# Patient Record
Sex: Male | Born: 1990 | Race: Black or African American | Hispanic: No | Marital: Single | Smoking: Former smoker
Health system: Southern US, Community
[De-identification: ages and names within clinical notes are randomized; demographics above are authoritative.]

---

## 2018-01-09 ENCOUNTER — Other Ambulatory Visit: Payer: Self-pay

## 2018-01-09 ENCOUNTER — Emergency Department (HOSPITAL_COMMUNITY)
Admission: EM | Admit: 2018-01-09 | Discharge: 2018-01-09 | Disposition: A | Discharge status: Home / Self Care | Payer: Self-pay | Attending: Emergency Medicine | Admitting: Emergency Medicine

## 2018-01-09 ENCOUNTER — Encounter (HOSPITAL_COMMUNITY): Payer: Self-pay

## 2018-01-09 DIAGNOSIS — F17210 Nicotine dependence, cigarettes, uncomplicated: Secondary | ICD-10-CM | POA: Insufficient documentation

## 2018-01-09 DIAGNOSIS — R030 Elevated blood-pressure reading, without diagnosis of hypertension: Secondary | ICD-10-CM | POA: Insufficient documentation

## 2018-01-09 DIAGNOSIS — R369 Urethral discharge, unspecified: Secondary | ICD-10-CM | POA: Insufficient documentation

## 2018-01-09 DIAGNOSIS — Z202 Contact with and (suspected) exposure to infections with a predominantly sexual mode of transmission: Secondary | ICD-10-CM | POA: Insufficient documentation

## 2018-01-09 MED ORDER — CEFTRIAXONE SODIUM 250 MG IJ SOLR
250.0000 mg | Freq: Once | INTRAMUSCULAR | Status: AC
  Administered 2018-01-09: 250 mg via INTRAMUSCULAR
  Filled 2018-01-09: qty 250

## 2018-01-09 MED ORDER — METRONIDAZOLE 500 MG PO TABS
2000.0000 mg | ORAL_TABLET | Freq: Once | ORAL | Status: AC
  Administered 2018-01-09: 2000 mg via ORAL
  Filled 2018-01-09: qty 4

## 2018-01-09 MED ORDER — AZITHROMYCIN 250 MG PO TABS
1000.0000 mg | ORAL_TABLET | Freq: Once | ORAL | Status: AC
  Administered 2018-01-09: 1000 mg via ORAL
  Filled 2018-01-09: qty 4

## 2018-01-09 NOTE — ED Triage Notes (Signed)
Pt states that he has been have penial discharge for 2 days. Some burning with urination, denies abdominal pain.

## 2018-01-09 NOTE — ED Notes (Signed)
Pt stable, ambulatory, states understanding of discharge instructions 

## 2018-01-09 NOTE — Discharge Instructions (Addendum)
Medications: You are empirically treated for gonorrhea, chlamydia, and trichomonas today. Use a condom with every sexual encounter Follow up with the health department regarding today's visit.  Please return to the ER for worsening symptoms, high fevers or persistent vomiting.  You have been tested for chlamydia and gonorrhea. These results will be available in approximately 3 days. You will be notified if they are positive.   It is very important to practice safe sex and use condoms when sexually active. If your results are positive you need to notify all sexual partners so they can be treated as well. The website [email or web address].net/ can be used to send anonymous text messages or emails to alert sexual contacts.   SEEK IMMEDIATE MEDICAL CARE IF:  You develop an oral temperature above 100.4 F (38.9 C), not controlled by medications or lasting more than 2 days.  You develop testicular pain, rectal pain, abdominal pain, nausea or vomiting.  Vitals:   01/09/18 1742  BP: (!) 149/98  Pulse: 78  Resp: 18  Temp: 98.6 F (37 C)  SpO2: 99%   Your blood pressure is elevated today.  Please follow-up with your primary care provider.  To find a primary care or specialty doctor please call [phone] or [phone] to access " Find a Doctor Service."  You may also go on the [facility] website at [email or web address]  There are also multiple [facility] and [facility] practices throughout the [place] that are frequently accepting new patients. You may find a clinic that is close to your home and contact them.  [facility] - [address] [place] [phone]  [facility] in [place] (also locations in [place] and [place]) - [address] [place] [phone]  [facility] Department - [address] [place] [phone]   Thank you for allowing  [place] to participate in your care today.

## 2018-01-09 NOTE — ED Provider Notes (Signed)
[facility] EMERGENCY DEPARTMENT Provider Note   CSN: [id number] Arrival date & time: 01/09/18  1723     History   Chief Complaint Chief Complaint  Patient presents with  . SEXUALLY TRANSMITTED DISEASE    HPI Jerome Duran is a 27 y.o. male.  HPI  Patient is a 27 year old male with no significant past medical history presenting for penile discharge.  He reports that the symptoms have been occurring for the past 3 days.  He is concerned about sexually transmitted infection.  He reports that he has had 4 male sexual partners with whom he engaged in vaginal intercourse over the past 3 months.  Denies oral intercourse.  Denies anal intercourse.  Patient denies any fevers, chills, lower abdominal pain, testicular pain or swelling, rectal pain, or nausea or vomiting.  History reviewed. No pertinent past medical history.  There are no active problems to display for this patient.   History reviewed. No pertinent surgical history.      Home Medications    Prior to Admission medications   Not on File    Family History No family history on file.  Social History Social History   Tobacco Use  . Smoking status: Current Every Day Smoker    Packs/day: 0.25    Types: Cigarettes  . Smokeless tobacco: Never Used  Substance Use Topics  . Alcohol use: Never    Frequency: Never  . Drug use: Never     Allergies   Patient has no known allergies.   Review of Systems Review of Systems  Constitutional: Negative for chills and fever.  Gastrointestinal: Negative for abdominal pain, nausea, rectal pain and vomiting.  Genitourinary: Positive for discharge. Negative for dysuria, penile pain, penile swelling, scrotal swelling and testicular pain.  Skin: Negative for rash.     Physical Exam Updated Vital Signs BP (!) 149/98 (BP Location: Right Arm)   Pulse 78   Temp 98.6 F (37 C) (Oral)   Resp 18   Ht 5\' 5"  (1.651 m)   Wt 79.8 kg   SpO2 99%   BMI  29.29 kg/m   Physical Exam  Constitutional: He appears well-developed and well-nourished. No distress.  Sitting comfortably in bed.  HENT:  Head: Normocephalic and atraumatic.  Eyes: Conjunctivae are normal. Right eye exhibits no discharge. Left eye exhibits no discharge.  EOMs normal to gross examination.  Neck: Normal range of motion.  Cardiovascular: Normal rate and regular rhythm.  Intact, 2+ radial pulse.  Pulmonary/Chest:  Normal respiratory effort. Patient converses comfortably. No audible wheeze or stridor.  Abdominal: Soft. He exhibits no distension. There is no tenderness. There is no guarding.  Genitourinary:  Genitourinary Comments: GU examination performed with EMT chaperone present.  Patient has horizontal chain lymphadenopathy.  No lesions of the testicles, penis, or inguinal region.  No rash.  No pain to penile shaft.  There is milky white discharge expressed from the penis.  No testicular pain, swelling, or erythema.  Musculoskeletal: Normal range of motion.  Neurological: He is alert.  Cranial nerves intact to gross observation. Patient moves extremities without difficulty.  Skin: Skin is warm and dry. He is not diaphoretic.  Psychiatric: He has a normal mood and affect. His behavior is normal. Judgment and thought content normal.  Nursing note and vitals reviewed.    ED Treatments / Results  Labs (all labs ordered are listed, but only abnormal results are displayed) Labs Reviewed  GC/CHLAMYDIA PROBE AMP ([place]) NOT AT [facility]  EKG None  Radiology No results found.  Procedures Procedures (including critical care time)  Medications Ordered in ED Medications  cefTRIAXone (ROCEPHIN) injection 250 mg (has no administration in time range)  azithromycin (ZITHROMAX) tablet 1,000 mg (has no administration in time range)  metroNIDAZOLE (FLAGYL) tablet 2,000 mg (has no administration in time range)     Initial Impression / Assessment and Plan / ED  Course  I have reviewed the triage vital signs and the nursing notes.  Pertinent labs & imaging results that were available during my care of the patient were reviewed by me and considered in my medical decision making (see chart for details).  Clinical Course as of Jan 09 2054  Tue Jan 09, 2018  2045 Declined HIV/Syphilis testing.   [AM]    Clinical Course User Index [AM] Elisha Ponder, PA-C    Patient is well-appearing and in no acute distress.  Based on symptoms and exam, suspect urethritis.  No signs of epididymitis, prostatitis, or ascending infections.  Will treat empirically.  Patient declined HIV and syphilis testing, but does state that he will follow-up with the health department for this testing.  Return precautions were given for any testicular pain, abdominal pain, fever chills, nausea, vomiting, rectal pain.  Patient informed that he will receive a call for positive results, and he will need to notify partners.  Patient is in understanding and agrees with the plan of care.  Blood pressure is elevated today.  Recommended follow-up with primary care.  Final Clinical Impressions(s) / ED Diagnoses   Final diagnoses:  STD exposure  Elevated blood pressure reading without diagnosis of hypertension    ED Discharge Orders    None       Delia Chimes 01/09/18 2106    Tilden Fossa, MD 01/15/18 [phone]

## 2018-01-09 NOTE — ED Notes (Signed)
Gave patient turkey sandwich and drink  

## 2018-01-11 LAB — GC/CHLAMYDIA PROBE AMP (~~LOC~~) NOT AT ARMC
CHLAMYDIA, DNA PROBE: POSITIVE — AB
NEISSERIA GONORRHEA: POSITIVE — AB

## 2018-06-15 ENCOUNTER — Emergency Department (HOSPITAL_COMMUNITY)
Admission: EM | Admit: 2018-06-15 | Discharge: 2018-06-16 | Disposition: A | Discharge status: Home / Self Care | Payer: Self-pay | Attending: Emergency Medicine | Admitting: Emergency Medicine

## 2018-06-15 ENCOUNTER — Encounter (HOSPITAL_COMMUNITY): Payer: Self-pay | Admitting: Emergency Medicine

## 2018-06-15 ENCOUNTER — Other Ambulatory Visit: Payer: Self-pay

## 2018-06-15 DIAGNOSIS — F17210 Nicotine dependence, cigarettes, uncomplicated: Secondary | ICD-10-CM | POA: Insufficient documentation

## 2018-06-15 DIAGNOSIS — K625 Hemorrhage of anus and rectum: Secondary | ICD-10-CM | POA: Insufficient documentation

## 2018-06-15 DIAGNOSIS — K529 Noninfective gastroenteritis and colitis, unspecified: Secondary | ICD-10-CM | POA: Insufficient documentation

## 2018-06-15 LAB — CBC
HCT: 48.7 % (ref 39.0–52.0)
Hemoglobin: 15.9 g/dL (ref 13.0–17.0)
MCH: 29.4 pg (ref 26.0–34.0)
MCHC: 32.6 g/dL (ref 30.0–36.0)
MCV: 90 fL (ref 80.0–100.0)
Platelets: 396 10*3/uL (ref 150–400)
RBC: 5.41 MIL/uL (ref 4.22–5.81)
RDW: 13.1 % (ref 11.5–15.5)
WBC: 6.9 10*3/uL (ref 4.0–10.5)
nRBC: 0 % (ref 0.0–0.2)

## 2018-06-15 LAB — POC OCCULT BLOOD, ED: Fecal Occult Bld: POSITIVE — AB

## 2018-06-15 LAB — COMPREHENSIVE METABOLIC PANEL
ALT: 17 U/L (ref 0–44)
AST: 23 U/L (ref 15–41)
Albumin: 3.7 g/dL (ref 3.5–5.0)
Alkaline Phosphatase: 88 U/L (ref 38–126)
Anion gap: 11 (ref 5–15)
BUN: 8 mg/dL (ref 6–20)
CO2: 27 mmol/L (ref 22–32)
Calcium: 9.3 mg/dL (ref 8.9–10.3)
Chloride: 101 mmol/L (ref 98–111)
Creatinine, Ser: 1.06 mg/dL (ref 0.61–1.24)
GFR calc Af Amer: >60 mL/min (ref >60)
GFR calc non Af Amer: >60 mL/min (ref >60)
Glucose, Bld: 104 mg/dL — ABNORMAL HIGH (ref 70–99)
Potassium: 3.6 mmol/L (ref 3.5–5.1)
Sodium: 139 mmol/L (ref 135–145)
Total Bilirubin: 0.3 mg/dL (ref 0.3–1.2)
Total Protein: 7.6 g/dL (ref 6.5–8.1)

## 2018-06-15 LAB — TYPE AND SCREEN
ABO/RH(D): B POS
Antibody Screen: NEGATIVE

## 2018-06-15 LAB — ABO/RH: ABO/RH(D): B POS

## 2018-06-15 MED ORDER — SODIUM CHLORIDE 0.9 % IV BOLUS
1000.0000 mL | Freq: Once | INTRAVENOUS | Status: AC
  Administered 2018-06-16: 1000 mL via INTRAVENOUS

## 2018-06-15 NOTE — ED Notes (Signed)
ED Provider at bedside. 

## 2018-06-15 NOTE — ED Triage Notes (Signed)
C/o diarrhea, rectal bleeding, and generalized abd cramping x 4-5 days.

## 2018-06-15 NOTE — ED Provider Notes (Addendum)
[facility] EMERGENCY DEPARTMENT Provider Note   CSN: [id number] Arrival date & time: 06/15/18  1956    History   Chief Complaint Chief Complaint  Patient presents with  . Diarrhea  . Rectal Bleeding    HPI Jerome Duran is a 28 y.o. male with a hx of no major medical problems presents to the Emergency Department complaining of gradual, persistent, progressively worsening diarrhea onset 4-5 days ago.  Pt reports stools are very loose and fluffy with some associated bleeding.  Pt reports a moderate amount of bright red bleeding only with bowel movements.  Pt reports generalized abd soreness that becomes intense abd cramping and pain just before and during bowel movement.  Pain resolves after BMs.  Pt denies travel, known sick contacts, camping, change in diet.  Pt denies FHx of crohn's or UC.  Pt denies hx of chronic constipation or diarrhea. Pt tried 1 dose of imodium and 2 doses of pepto bismol without relief.  No known aggravating or alleviating factors.  Pt reports eating and drinking normally.  No fever, cough, nasal congestion, shortness of breath.     The history is provided by the patient and medical records. No language interpreter was used.    History reviewed. No pertinent past medical history.  There are no active problems to display for this patient.   History reviewed. No pertinent surgical history.      Home Medications    Prior to Admission medications   Medication Sig Start Date End Date Taking? Authorizing Provider  ciprofloxacin (CIPRO) 500 MG tablet Take 1 tablet (500 mg total) by mouth 2 (two) times daily. One po bid x 7 days 06/16/18   Teghan Philbin, Dahlia Client, PA-C  metroNIDAZOLE (FLAGYL) 500 MG tablet Take 1 tablet (500 mg total) by mouth 2 (two) times daily. 06/16/18   Swayze Pries, Dahlia Client, PA-C  ondansetron (ZOFRAN ODT) 4 MG disintegrating tablet 4mg  ODT q4 hours prn nausea/vomit 06/16/18   Kiara Keep, Dahlia Client, PA-C    Family History  No family history on file.  Social History Social History   Tobacco Use  . Smoking status: Current Every Day Smoker    Packs/day: 0.25    Types: Cigarettes  . Smokeless tobacco: Never Used  Substance Use Topics  . Alcohol use: Never    Frequency: Never  . Drug use: Never     Allergies   Patient has no known allergies.   Review of Systems Review of Systems  Constitutional: Negative for appetite change, diaphoresis, fatigue, fever and unexpected weight change.  HENT: Negative for mouth sores.   Eyes: Negative for visual disturbance.  Respiratory: Negative for cough, chest tightness, shortness of breath and wheezing.   Cardiovascular: Negative for chest pain.  Gastrointestinal: Positive for abdominal pain, anal bleeding, blood in stool, diarrhea and hematochezia. Negative for constipation, nausea and vomiting.  Endocrine: Negative for polydipsia, polyphagia and polyuria.  Genitourinary: Negative for dysuria, frequency, hematuria and urgency.  Musculoskeletal: Negative for back pain and neck stiffness.  Skin: Negative for rash.  Allergic/Immunologic: Negative for immunocompromised state.  Neurological: Negative for syncope, light-headedness and headaches.  Hematological: Does not bruise/bleed easily.  Psychiatric/Behavioral: Negative for sleep disturbance. The patient is not nervous/anxious.      Physical Exam Updated Vital Signs BP (!) 155/106 (BP Location: Right Arm)   Pulse 84   Temp 98.9 F (37.2 C) (Oral)   Resp 18   SpO2 99%   Physical Exam Vitals signs and nursing note reviewed. Exam conducted with a chaperone  present.  Constitutional:      General: He is not in acute distress.    Appearance: He is well-developed. He is not diaphoretic.     Comments: Awake, alert, nontoxic appearance  HENT:     Head: Normocephalic and atraumatic.     Comments: Moist mucous membranes    Mouth/Throat:     Pharynx: No oropharyngeal exudate.  Eyes:     General: No scleral  icterus.    Conjunctiva/sclera: Conjunctivae normal.     Comments: Mucous membranes NOT pale  Neck:     Musculoskeletal: Normal range of motion and neck supple.  Cardiovascular:     Rate and Rhythm: Normal rate and regular rhythm.  Pulmonary:     Effort: Pulmonary effort is normal. No respiratory distress.     Breath sounds: Normal breath sounds. No wheezing.  Abdominal:     General: Bowel sounds are normal.     Palpations: Abdomen is soft. There is no mass.     Tenderness: There is generalized abdominal tenderness ( throughout). There is no guarding or rebound.  Genitourinary:    Prostate: Normal.     Rectum: Guaiac result positive. No mass, tenderness, anal fissure, external hemorrhoid or internal hemorrhoid. Normal anal tone.     Comments: No obvious source of bleeding, but BRB on DRE noted. Musculoskeletal: Normal range of motion.  Skin:    General: Skin is warm and dry.  Neurological:     Mental Status: He is alert.     Comments: Speech is clear and goal oriented Moves extremities without ataxia      ED Treatments / Results  Labs (all labs ordered are listed, but only abnormal results are displayed) Labs Reviewed  COMPREHENSIVE METABOLIC PANEL - Abnormal; Notable for the following components:      Result Value   Glucose, Bld 104 (*)    All other components within normal limits  POC OCCULT BLOOD, ED - Abnormal; Notable for the following components:   Fecal Occult Bld POSITIVE (*)    All other components within normal limits  GASTROINTESTINAL PANEL BY PCR, STOOL (REPLACES STOOL CULTURE)  OVA + PARASITE EXAM  CBC  TYPE AND SCREEN  ABO/RH    EKG None  Radiology Ct Abdomen Pelvis W Contrast  Result Date: 06/16/2018 CLINICAL DATA:  Initial evaluation for acute generalized abdominal pain. EXAM: CT ABDOMEN AND PELVIS WITH CONTRAST TECHNIQUE: Multidetector CT imaging of the abdomen and pelvis was performed using the standard protocol following bolus administration of  intravenous contrast. CONTRAST:  OMNIPAQUE IOHEXOL 300 MG/ML  SOLN COMPARISON:  None. FINDINGS: Lower chest: Visualized lung bases are clear. Hepatobiliary: Liver demonstrates a normal contrast enhanced appearance. Gallbladder within normal limits. No biliary dilatation. Pancreas: Pancreas within normal limits. Spleen: Spleen within normal limits. Adrenals/Urinary Tract: Adrenal glands are normal. Kidneys equal in size with symmetric enhancement. Subcentimeter hypodensity within the interpolar left kidney noted, too small the characterize, but statistically likely reflects a small cyst. No nephrolithiasis, hydronephrosis, or focal enhancing renal mass. No hydroureter. Partially distended bladder within normal limits. Stomach/Bowel: Stomach within normal limits. No evidence for bowel obstruction. Normal appendix. Circumferential wall thickening with edema and mild hazy stranding about the colon extending from the mid-distal transverse colon to the rectum, consistent with acute colitis. This could be either infectious or inflammatory in nature. No associated complication. No other acute inflammatory changes seen about the bowels. Vascular/Lymphatic: Normal intravascular enhancement seen throughout the intra-abdominal aorta. Mesenteric vessels are patent proximally. No adenopathy. Reproductive:  Prostate and seminal vesicles are normal. Other: No free air or fluid. Musculoskeletal: No acute osseous finding. No discrete lytic or blastic osseous lesions. IMPRESSION: 1. Circumferential wall thickening with edema and hazy stranding about the colon, consistent with acute colitis. This may be either infectious or inflammatory in nature. No complication. 2. No other acute intra-abdominal or pelvic process. Electronically Signed   By: Rise MuBenjamin  McClintock M.D.   On: 06/16/2018 01:36    Procedures Procedures (including critical care time)  Medications Ordered in ED Medications  ciprofloxacin (CIPRO) tablet 500 mg  (has no administration in time range)  metroNIDAZOLE (FLAGYL) tablet 500 mg (has no administration in time range)  sodium chloride 0.9 % bolus 1,000 mL (0 mLs Intravenous Stopped 06/16/18 0238)  iohexol (OMNIPAQUE) 300 MG/ML solution 100 mL (100 mLs Intravenous Contrast Given 06/16/18 0045)     Initial Impression / Assessment and Plan / ED Course  I have reviewed the triage vital signs and the nursing notes.  Pertinent labs & imaging results that were available during my care of the patient were reviewed by me and considered in my medical decision making (see chart for details).  Clinical Course as of Jun 15 236  Fri Jun 15, 2018  2356 No anemia  Hemoglobin: 15.9 [HM]  2356 Pt initially hypertensive with spontaneous resolution during time in the ED  BP: 122/69 [HM]  2357 No tachycardia to suggest dehydration  Pulse Rate: 74 [HM]    Clinical Course User Index [HM] Cottrell Gentles, Theotis BarrioHannah, PA-C       Sirius Worrel was evaluated in Emergency Department on 06/16/2018 for the symptoms described in the history of present illness. He was evaluated in the context of the global COVID-19 pandemic, which necessitated consideration that the patient might be at risk for infection with the SARS-CoV-2 virus that causes COVID-19. Institutional protocols and algorithms that pertain to the evaluation of patients at risk for COVID-19 are in a state of rapid change based on information released by regulatory bodies including the CDC and federal and state organizations. These policies and algorithms were followed during the patient's care in the ED.   Pt presents with abdominal pain, diarrhea and rectal bleeding.  No evidence of hemorrhoids or anal fissure on exam.  He does have bright red blood per rectum on DRE.  No previous history of ulcerative colitis or Crohn's disease.  CT scan does show active colitis.  I personally evaluated these images.  GI pathogen and O&P pending.  Will give Cipro and Flagyl for  potential bacterial etiology.  Repeat exam, abdomen remains slightly tender but is without rebound, guarding or distention.  Discussed with patient possibility of bacterial, viral or autoimmune etiology.  He will need close gastroenterology follow-up.  Also discussed reasons to return immediately to the emergency department.  States understanding and is in agreement with the plan.  Final Clinical Impressions(s) / ED Diagnoses   Final diagnoses:  Colitis  Rectal bleeding    ED Discharge Orders         Ordered    ciprofloxacin (CIPRO) 500 MG tablet  2 times daily     06/16/18 0231    metroNIDAZOLE (FLAGYL) 500 MG tablet  2 times daily     06/16/18 0231    ondansetron (ZOFRAN ODT) 4 MG disintegrating tablet     06/16/18 0231             Jayvin Hurrell, [place], PA-C 06/16/18 0239    Long, Arlyss RepressJoshua G, MD 06/16/18 1344

## 2018-06-16 ENCOUNTER — Emergency Department (HOSPITAL_COMMUNITY): Payer: Self-pay

## 2018-06-16 ENCOUNTER — Telehealth (HOSPITAL_COMMUNITY): Payer: Self-pay

## 2018-06-16 LAB — GASTROINTESTINAL PANEL BY PCR, STOOL (REPLACES STOOL CULTURE)
Adenovirus F40/41: NOT DETECTED
Astrovirus: NOT DETECTED
Campylobacter species: DETECTED — AB
Cryptosporidium: NOT DETECTED
Cyclospora cayetanensis: NOT DETECTED
Entamoeba histolytica: NOT DETECTED
Enteroaggregative E coli (EAEC): NOT DETECTED
Enteropathogenic E coli (EPEC): NOT DETECTED
Enterotoxigenic E coli (ETEC): NOT DETECTED
Giardia lamblia: DETECTED — AB
Norovirus GI/GII: NOT DETECTED
Plesimonas shigelloides: NOT DETECTED
Rotavirus A: NOT DETECTED
Salmonella species: NOT DETECTED
Sapovirus (I, II, IV, and V): NOT DETECTED
Shiga like toxin producing E coli (STEC): NOT DETECTED
Shigella/Enteroinvasive E coli (EIEC): DETECTED — AB
Vibrio cholerae: NOT DETECTED
Vibrio species: NOT DETECTED
Yersinia enterocolitica: NOT DETECTED

## 2018-06-16 MED ORDER — METRONIDAZOLE 500 MG PO TABS: 500.0000 mg | ORAL_TABLET | Freq: Two times a day (BID) | ORAL | 0 refills | Status: AC

## 2018-06-16 MED ORDER — ONDANSETRON 4 MG PO TBDP: ORAL_TABLET | ORAL | 0 refills | Status: AC

## 2018-06-16 MED ORDER — CIPROFLOXACIN HCL 500 MG PO TABS: 500.0000 mg | ORAL_TABLET | Freq: Two times a day (BID) | ORAL | 0 refills | Status: AC

## 2018-06-16 MED ORDER — CIPROFLOXACIN HCL 500 MG PO TABS
500.0000 mg | ORAL_TABLET | Freq: Once | ORAL | Status: AC
  Administered 2018-06-16: 03:00:00 500 mg via ORAL
  Filled 2018-06-16: qty 1

## 2018-06-16 MED ORDER — METRONIDAZOLE 500 MG PO TABS
500.0000 mg | ORAL_TABLET | Freq: Once | ORAL | Status: AC
  Administered 2018-06-16: 03:00:00 500 mg via ORAL
  Filled 2018-06-16: qty 1

## 2018-06-16 MED ORDER — IOHEXOL 300 MG/ML  SOLN
100.0000 mL | Freq: Once | INTRAMUSCULAR | Status: AC | PRN
  Administered 2018-06-16: 01:00:00 100 mL via INTRAVENOUS

## 2018-06-16 NOTE — Discharge Instructions (Addendum)
1. Medications: zofran, Cipro, Flagyl, usual home medications °2. Treatment: rest, drink plenty of fluids, advance diet slowly °3. Follow Up: Please followup with your primary doctor in 2 days for discussion of your diagnoses and further evaluation after today's visit; if you do not have a primary care doctor use the resource guide provided to find one; Please return to the ER for persistent vomiting, high fevers or worsening symptoms ° ° ° °

## 2018-06-20 LAB — O&P RESULT

## 2018-06-20 LAB — OVA + PARASITE EXAM

## 2018-06-23 ENCOUNTER — Emergency Department (HOSPITAL_COMMUNITY)
Admission: EM | Admit: 2018-06-23 | Discharge: 2018-06-23 | Disposition: A | Discharge status: Home / Self Care | Payer: Self-pay | Attending: Emergency Medicine | Admitting: Emergency Medicine

## 2018-06-23 ENCOUNTER — Other Ambulatory Visit: Payer: Self-pay

## 2018-06-23 ENCOUNTER — Encounter (HOSPITAL_COMMUNITY): Payer: Self-pay | Admitting: *Deleted

## 2018-06-23 DIAGNOSIS — B309 Viral conjunctivitis, unspecified: Secondary | ICD-10-CM | POA: Insufficient documentation

## 2018-06-23 DIAGNOSIS — F17210 Nicotine dependence, cigarettes, uncomplicated: Secondary | ICD-10-CM | POA: Insufficient documentation

## 2018-06-23 DIAGNOSIS — Z79899 Other long term (current) drug therapy: Secondary | ICD-10-CM | POA: Insufficient documentation

## 2018-06-23 MED ORDER — ARTIFICIAL TEARS OPHTHALMIC OINT
TOPICAL_OINTMENT | Freq: Once | OPHTHALMIC | Status: AC
  Administered 2018-06-23: 1 via OPHTHALMIC
  Filled 2018-06-23: qty 3.5

## 2018-06-23 NOTE — ED Provider Notes (Signed)
[facility]-EMERGENCY DEPT Provider Note   CSN: [id number] Arrival date & time: 06/23/18  1926    History   Chief Complaint Chief Complaint  Patient presents with  . Conjunctivitis    HPI Jerome Duran is a 28 y.o. male.     28 y.o male with no PMH presents to the ED with a chief complaint of BL eye redness x 1 week. Patient reports he first noted the redness in one eye and then had switched to the other eye.  He reports there is itching to it, states when waking up in the mornings there is yellow crusting surrounding both eyes.  Patient was seen in the emergency department a week ago for diarrhea, state the symptoms have resolved and his eyes began appearing red.  He also reports some oral lesions in his mouth, states he has got multiple canker sores present.  He denies any fever, difficulty with vision, or vission changes.        Home Medications    Prior to Admission medications   Medication Sig Start Date End Date Taking? Authorizing Provider  ciprofloxacin (CIPRO) 500 MG tablet Take 1 tablet (500 mg total) by mouth 2 (two) times daily. One po bid x 7 days 06/16/18   Muthersbaugh, Dahlia ClientHannah, PA-C  metroNIDAZOLE (FLAGYL) 500 MG tablet Take 1 tablet (500 mg total) by mouth 2 (two) times daily. 06/16/18   Muthersbaugh, Dahlia ClientHannah, PA-C  ondansetron (ZOFRAN ODT) 4 MG disintegrating tablet 4mg  ODT q4 hours prn nausea/vomit 06/16/18   Muthersbaugh, Dahlia ClientHannah, PA-C    Family History No family history on file.  Social History Social History   Tobacco Use  . Smoking status: Current Every Day Smoker    Packs/day: 0.25    Types: Cigarettes  . Smokeless tobacco: Never Used  Substance Use Topics  . Alcohol use: Never    Frequency: Never  . Drug use: Never     Allergies   Patient has no known allergies.   Review of Systems Review of Systems  Constitutional: Negative for fever.  Eyes: Positive for redness and itching. Negative for pain.  Neurological:  Negative for headaches.     Physical Exam Updated Vital Signs BP (!) 144/103 (BP Location: Left Arm)   Pulse 96   Temp 99.2 F (37.3 C) (Oral)   Resp 18   SpO2 95%   Physical Exam Vitals signs and nursing note reviewed.  Constitutional:      Appearance: He is well-developed.  HENT:     Head: Normocephalic and atraumatic.  Eyes:     General: No scleral icterus.    Conjunctiva/sclera:     Right eye: Right conjunctiva is injected. Chemosis present. No exudate or hemorrhage.    Left eye: Left conjunctiva is injected. No exudate or hemorrhage.    Pupils: Pupils are equal, round, and reactive to light.  Neck:     Musculoskeletal: Normal range of motion.  Cardiovascular:     Rate and Rhythm: Normal rate and regular rhythm.     Heart sounds: Normal heart sounds.  Pulmonary:     Effort: Pulmonary effort is normal.     Breath sounds: Normal breath sounds. No wheezing.  Chest:     Chest wall: No tenderness.  Abdominal:     General: Bowel sounds are normal. There is no distension.     Palpations: Abdomen is soft.     Tenderness: There is no abdominal tenderness.  Musculoskeletal:        General: No  tenderness or deformity.  Skin:    General: Skin is warm and dry.  Neurological:     Mental Status: He is alert and oriented to person, place, and time.     Visual Acuity  Right Eye Distance: 20/40 Left Eye Distance: 20/50 Bilateral Distance: 20/50  Right Eye Near:   Left Eye Near:    Bilateral Near:       ED Treatments / Results  Labs (all labs ordered are listed, but only abnormal results are displayed) Labs Reviewed - No data to display  EKG None  Radiology No results found.  Procedures Procedures (including critical care time)  Medications Ordered in ED Medications  artificial tears (LACRILUBE) ophthalmic ointment (has no administration in time range)     Initial Impression / Assessment and Plan / ED Course  I have reviewed the triage vital signs and  the nursing notes.  Pertinent labs & imaging results that were available during my care of the patient were reviewed by me and considered in my medical decision making (see chart for details).      Patient with no PMH presents to the ED with a chief complaint of BL eye redness, has tried over the counter drops without relieve. Patient reports no changes in vision but does have crusting of the eyes in the morning. He reports no changes in vision, loss in vision.  Visual acuity was within normal limits, suspicion for viral versus bacterial conjunctivitis, more likely viral at this time.  Will provide patient with artificial tears.  He is to follow-up outpatient.  Denies any fevers, other complaints at this time.  Return precautions provided.   Final Clinical Impressions(s) / ED Diagnoses   Final diagnoses:  Acute viral conjunctivitis of both eyes    ED Discharge Orders    None       Claude Manges, Cordelia Poche 06/23/18 2027    Derwood Kaplan, MD 06/23/18 2042

## 2018-06-23 NOTE — Discharge Instructions (Signed)
I have provided drops to help with the redness to your eyes, please use this daily.  You may also take some Benadryl or Claritin daily to help with itching.  Please return to the ED if you experience any worsening symptoms, changes in vision or loss of vision.

## 2018-06-23 NOTE — ED Triage Notes (Signed)
Bilateral eye redness/irritation x 7 days.

## 2019-03-03 ENCOUNTER — Emergency Department (HOSPITAL_COMMUNITY)
Admission: EM | Admit: 2019-03-03 | Discharge: 2019-03-03 | Disposition: A | Discharge status: Home / Self Care | Payer: Self-pay | Attending: Emergency Medicine | Admitting: Emergency Medicine

## 2019-03-03 ENCOUNTER — Other Ambulatory Visit: Payer: Self-pay

## 2019-03-03 DIAGNOSIS — R369 Urethral discharge, unspecified: Secondary | ICD-10-CM | POA: Insufficient documentation

## 2019-03-03 DIAGNOSIS — F17210 Nicotine dependence, cigarettes, uncomplicated: Secondary | ICD-10-CM | POA: Insufficient documentation

## 2019-03-03 DIAGNOSIS — Z79899 Other long term (current) drug therapy: Secondary | ICD-10-CM | POA: Insufficient documentation

## 2019-03-03 LAB — URINALYSIS, ROUTINE W REFLEX MICROSCOPIC
Bilirubin Urine: NEGATIVE
Glucose, UA: NEGATIVE mg/dL
Ketones, ur: NEGATIVE mg/dL
Nitrite: NEGATIVE
Protein, ur: NEGATIVE mg/dL
Specific Gravity, Urine: 1.025 (ref 1.005–1.030)
WBC, UA: >50 WBC/hpf — ABNORMAL HIGH (ref 0–5)
pH: 5 (ref 5.0–8.0)

## 2019-03-03 LAB — HIV ANTIBODY (ROUTINE TESTING W REFLEX): HIV Screen 4th Generation wRfx: NONREACTIVE

## 2019-03-03 MED ORDER — AZITHROMYCIN 1 G PO PACK
1.0000 g | PACK | Freq: Once | ORAL | Status: AC
  Administered 2019-03-03: 09:00:00 1 g via ORAL
  Filled 2019-03-03: qty 1

## 2019-03-03 MED ORDER — CEFTRIAXONE SODIUM 250 MG IJ SOLR
250.0000 mg | Freq: Once | INTRAMUSCULAR | Status: AC
  Administered 2019-03-03: 250 mg via INTRAMUSCULAR
  Filled 2019-03-03: qty 250

## 2019-03-03 NOTE — ED Provider Notes (Signed)
[facility] EMERGENCY DEPARTMENT Provider Note   CSN: [id number] Arrival date & time: 03/03/19  0457     History Chief Complaint  Patient presents with  . Penile Discharge    Jerome Duran is a 28 y.o. male with no significant past medical history presents with a 2-day history of milky penile discharge.  Patient endorses two Duran male sexual partners in recent weeks.  He did not wear protection with one of them and suspects that he may have contracted an STI from that partner.  He denies any testicular pain or swelling, penile lesions, fevers or chills, recent illness, abdominal discomfort or nausea, rectal discomfort, dysuria, painful bowel movements, or other symptoms.  HPI     No past medical history on file.  There are no problems to display for this patient.   No past surgical history on file.     No family history on file.  Social History   Tobacco Use  . Smoking status: Current Every Day Smoker    Packs/day: 0.25    Types: Cigarettes  . Smokeless tobacco: Never Used  Substance Use Topics  . Alcohol use: Never  . Drug use: Never    Home Medications Prior to Admission medications   Medication Sig Start Date End Date Taking? Authorizing Provider  ciprofloxacin (CIPRO) 500 MG tablet Take 1 tablet (500 mg total) by mouth 2 (two) times daily. One po bid x 7 days 06/16/18   Jerome Duran, Jerome Soho, PA-C  metroNIDAZOLE (FLAGYL) 500 MG tablet Take 1 tablet (500 mg total) by mouth 2 (two) times daily. 06/16/18   Jerome Duran, Jerome Soho, PA-C  ondansetron (ZOFRAN ODT) 4 MG disintegrating tablet 4mg  ODT q4 hours prn nausea/vomit 06/16/18   Jerome Duran, Jerome Soho, PA-C    Allergies    Patient has no known allergies.  Review of Systems   Review of Systems  Constitutional: Negative for chills and fever.  Gastrointestinal: Negative for abdominal pain and nausea.  Genitourinary: Positive for discharge. Negative for difficulty urinating, dysuria, penile pain,  penile swelling, scrotal swelling and testicular pain.    Physical Exam Updated Vital Signs BP (!) 134/104 (BP Location: Right Arm)   Pulse 80   Temp 98 F (36.7 C) (Oral)   Resp 16   Ht 5\' 5"  (1.651 m)   Wt 80.7 kg   SpO2 99%   BMI 29.62 kg/m   Physical Exam Vitals and nursing note reviewed. Exam conducted with a chaperone present.  Constitutional:      Appearance: Normal appearance.  HENT:     Head: Normocephalic and atraumatic.  Eyes:     General: No scleral icterus.    Conjunctiva/sclera: Conjunctivae normal.  Cardiovascular:     Rate and Rhythm: Normal rate and regular rhythm.     Pulses: Normal pulses.     Heart sounds: Normal heart sounds.  Pulmonary:     Effort: Pulmonary effort is normal. No respiratory distress.     Breath sounds: Normal breath sounds.  Abdominal:     General: Abdomen is flat. There is no distension.     Palpations: Abdomen is soft.     Tenderness: There is no abdominal tenderness. There is no guarding.  Genitourinary:    Comments: Milky discharge expressed from tip of penis.  No testicular swelling or discomfort.  No penile lesions.  No erythema or swelling.  No lymphadenopathy. Skin:    General: Skin is dry.  Neurological:     Mental Status: He is alert.  GCS: GCS eye subscore is 4. GCS verbal subscore is 5. GCS motor subscore is 6.  Psychiatric:        Mood and Affect: Mood normal.        Behavior: Behavior normal.        Thought Content: Thought content normal.       ED Results / Procedures / Treatments   Labs (all labs ordered are listed, but only abnormal results are displayed) Labs Reviewed  URINALYSIS, ROUTINE W REFLEX MICROSCOPIC - Abnormal; Notable for the following components:      Result Value   APPearance HAZY (*)    Hgb urine dipstick SMALL (*)    Leukocytes,Ua MODERATE (*)    WBC, UA >50 (*)    Bacteria, UA RARE (*)    All other components within normal limits  HIV ANTIBODY (ROUTINE TESTING W REFLEX)  RAPID  HIV SCREEN (HIV 1/2 AB+AG)  GC/CHLAMYDIA PROBE AMP ([place]) NOT AT [facility]    EKG None  Radiology No results found.  Procedures Procedures (including critical care time)  Medications Ordered in ED Medications  cefTRIAXone (ROCEPHIN) injection 250 mg (250 mg Intramuscular Given 03/03/19 0842)  azithromycin (ZITHROMAX) powder 1 g (1 g Oral Given 03/03/19 0109)    ED Course  I have reviewed the triage vital signs and the nursing notes.  Pertinent labs & imaging results that were available during my care of the patient were reviewed by me and considered in my medical decision making (see chart for details).    MDM Rules/Calculators/A&P                      Patient is afebrile without abdominal tenderness, abdominal pain or painful bowel movements to indicate prostatitis.  No tenderness to palpation of the testes or epididymis to suggest orchitis or epididymitis.  STD cultures obtained including HIV, syphilis, gonorrhea and chlamydia. Patient to be discharged with instructions to follow up with PCP. Discussed importance of using protection when sexually active. Pt understands that they have GC/Chlamydia cultures pending and that they will need to inform all sexual partners if results return positive. Patient has been treated prophylactically with azithromycin and Rocephin.  Return precautions discussed and all questions were answered.    Final Clinical Impression(s) / ED Diagnoses Final diagnoses:  Penile discharge    Rx / DC Orders ED Discharge Orders    None       Jerome New, PA-C 03/03/19 3235    Jerome Laine, MD 03/03/19 1012

## 2019-03-03 NOTE — ED Triage Notes (Signed)
Pt reports penile discharge X2 days. No complaints of pain.

## 2019-03-03 NOTE — Discharge Instructions (Signed)
You have been treated presumptively today for gonorrhea and chlamydia.  ° °You have been tested today for gonorrhea and chlamydia as well as HIV and syphilis. These results will be available in approximately 3 days. You may check your MyChart account for results. Please inform all sexual partners of positive results and that they should be tested and treated as well. ° °Please wait 2 weeks and be sure that you and your partners are symptom free before returning to sexual activity. Please use protection with every sexual encounter. ° °Follow Up: Please followup with your primary doctor in 3 days for discussion of your diagnoses and further evaluation after today's visit; if you do not have a primary care doctor use the resource guide provided to find one; Please return to the ER for worsening symptoms, high fevers or persistent vomiting. ° °

## 2019-03-05 LAB — GC/CHLAMYDIA PROBE AMP (~~LOC~~) NOT AT ARMC
Chlamydia: NEGATIVE
Neisseria Gonorrhea: POSITIVE — AB

## 2019-05-19 ENCOUNTER — Emergency Department (HOSPITAL_COMMUNITY)
Admission: EM | Admit: 2019-05-19 | Discharge: 2019-05-19 | Disposition: A | Discharge status: Home / Self Care | Payer: Self-pay | Attending: Emergency Medicine | Admitting: Emergency Medicine

## 2019-05-19 ENCOUNTER — Encounter (HOSPITAL_COMMUNITY): Payer: Self-pay | Admitting: *Deleted

## 2019-05-19 ENCOUNTER — Other Ambulatory Visit: Payer: Self-pay

## 2019-05-19 DIAGNOSIS — Z5321 Procedure and treatment not carried out due to patient leaving prior to being seen by health care provider: Secondary | ICD-10-CM | POA: Insufficient documentation

## 2019-05-19 DIAGNOSIS — R0602 Shortness of breath: Secondary | ICD-10-CM | POA: Insufficient documentation

## 2019-05-19 NOTE — ED Notes (Signed)
Pt called for a room x 3, no answer. 

## 2019-05-19 NOTE — ED Notes (Signed)
Pt called for a room x 2, no answer. 

## 2019-05-19 NOTE — ED Notes (Signed)
Pt called for a room x 1, no answer.

## 2019-05-19 NOTE — ED Triage Notes (Addendum)
Pt reports some SOB and loss of sense of taste and smell about two days ago.  Pt is unaware of being of being exposed COVID from anyone. Pt a/o x 4.

## 2020-06-16 IMAGING — CT CT ABDOMEN AND PELVIS WITH CONTRAST
2 of 4 series · 15 of 46 positions shown, 17 images · IV contrast (Omni 300)
Comparison: None.

CLINICAL DATA: Initial evaluation for acute generalized abdominal
pain.

EXAM:
CT ABDOMEN AND PELVIS WITH CONTRAST
TECHNIQUE: Multidetector CT imaging of the abdomen and pelvis was performed
using the standard protocol following bolus administration of
intravenous contrast.
CONTRAST:  100mL OMNIPAQUE IOHEXOL 300 MG/ML  SOLN

[Series 3: a/p w/ 5mm · axial · 0.86mm/px · z∈[-555,-70]mm · 12 of 107 slices shown, 14 images]
[im 5/107  soft-tissue]
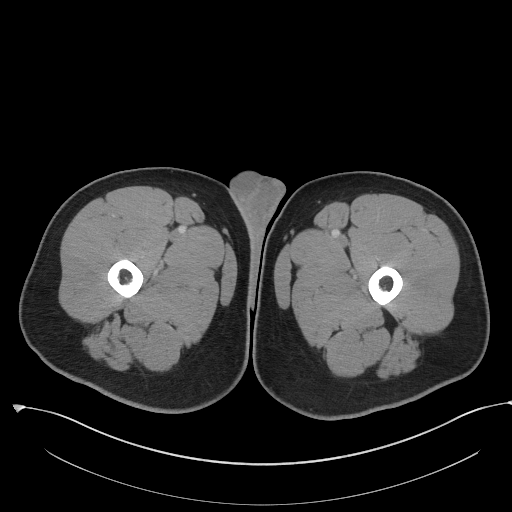
[im 5/107  bone]
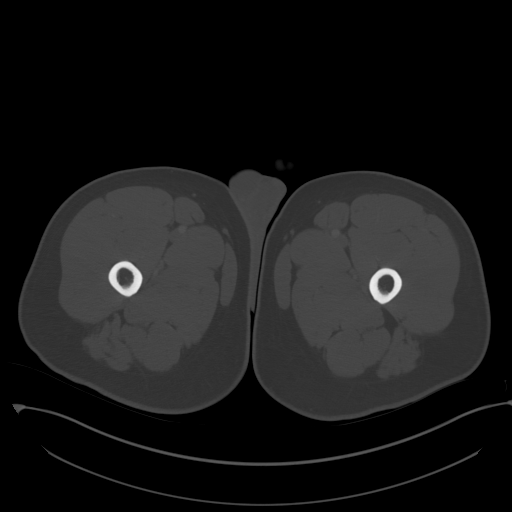
[im 15/107  soft-tissue]
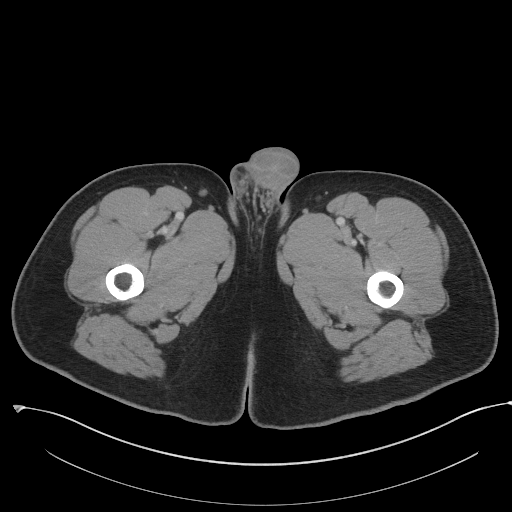
[im 25/107  soft-tissue]
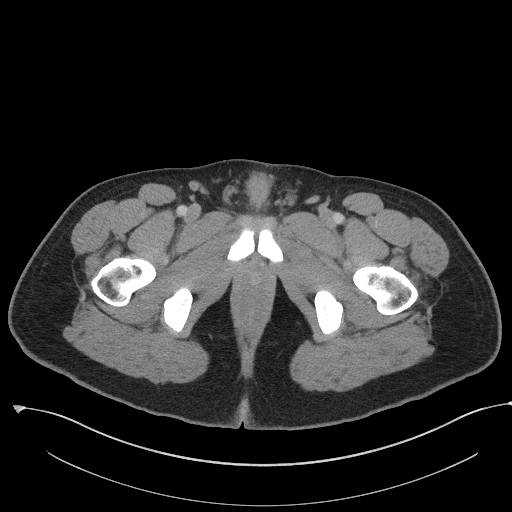
[im 34/107  soft-tissue]
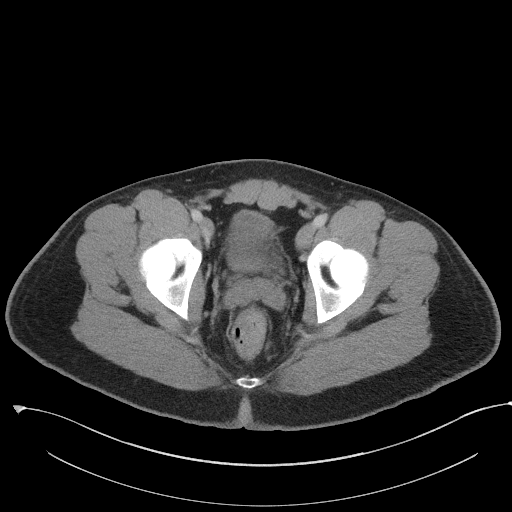
[im 39/107  soft-tissue]
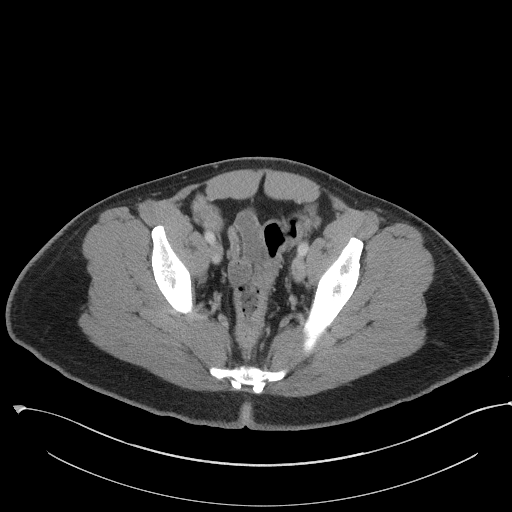
[im 49/107  soft-tissue]
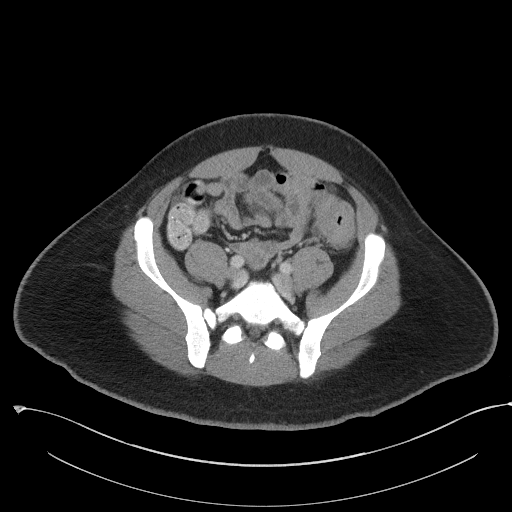
[im 58/107  soft-tissue]
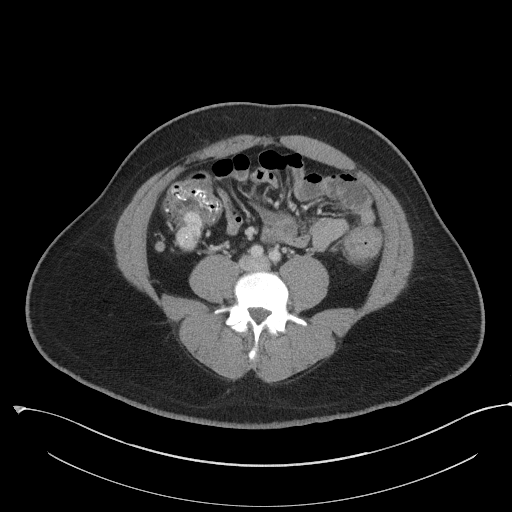
[im 68/107  soft-tissue]
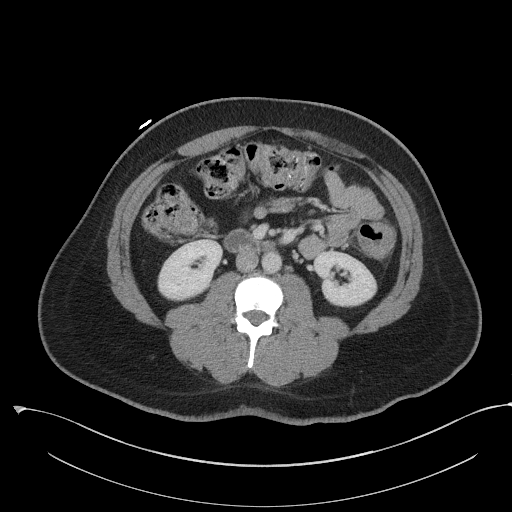
[im 73/107  soft-tissue]
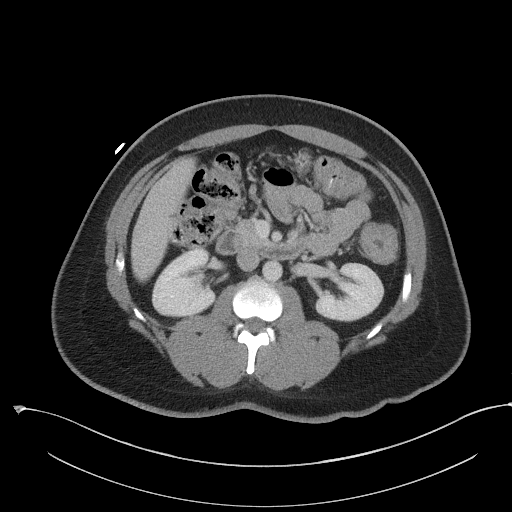
[im 73/107  bone]
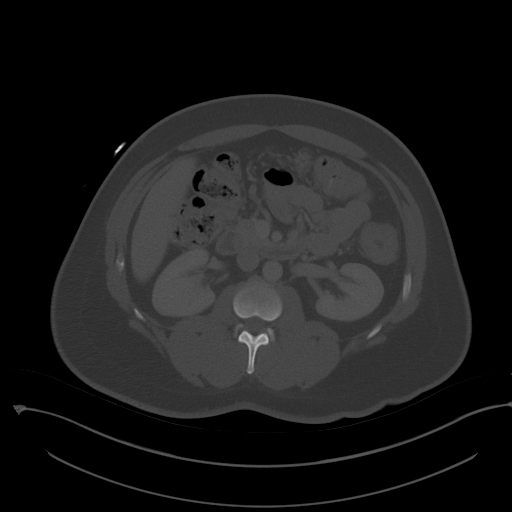
[im 82/107  soft-tissue]
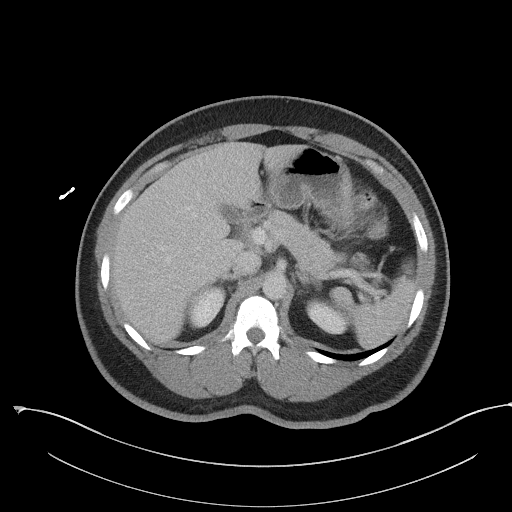
[im 92/107  soft-tissue]
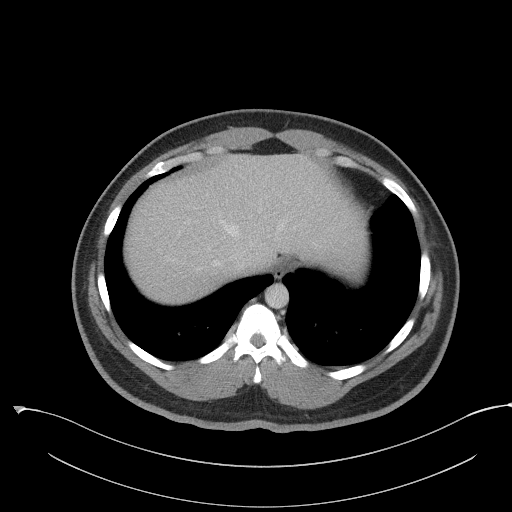
[im 102/107  soft-tissue]
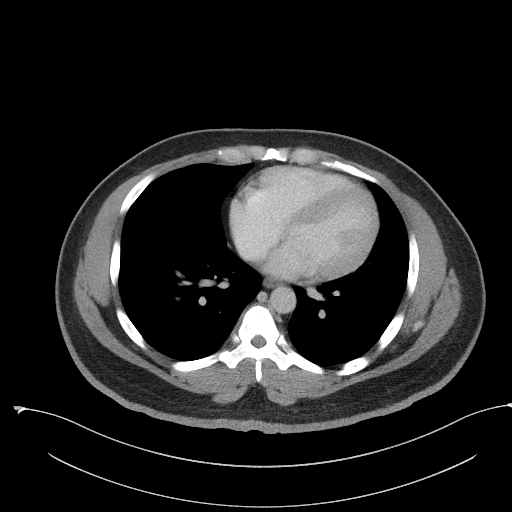

[Series 6: a/p w/ cor · coronal · 0.71mm/px · 3 of 151 slices shown]
[im 51/151  soft-tissue]
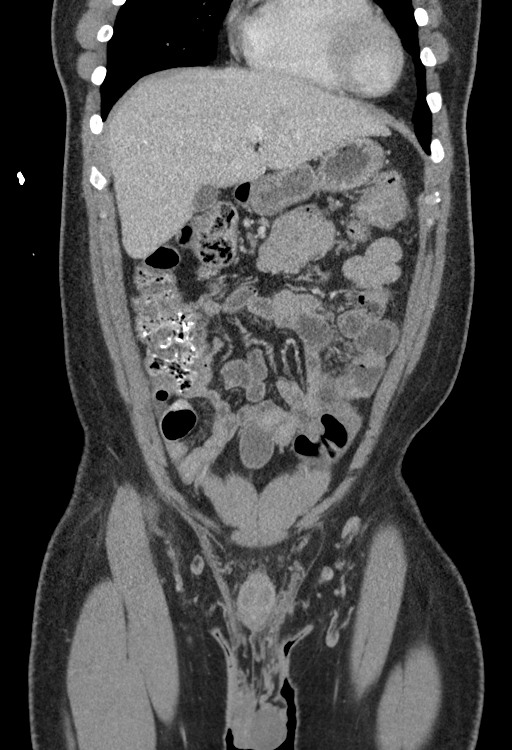
[im 67/151  soft-tissue]
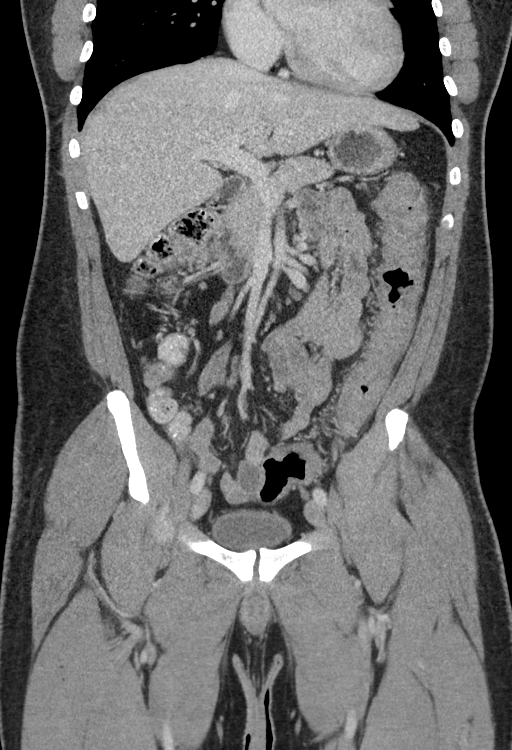
[im 84/151  soft-tissue]
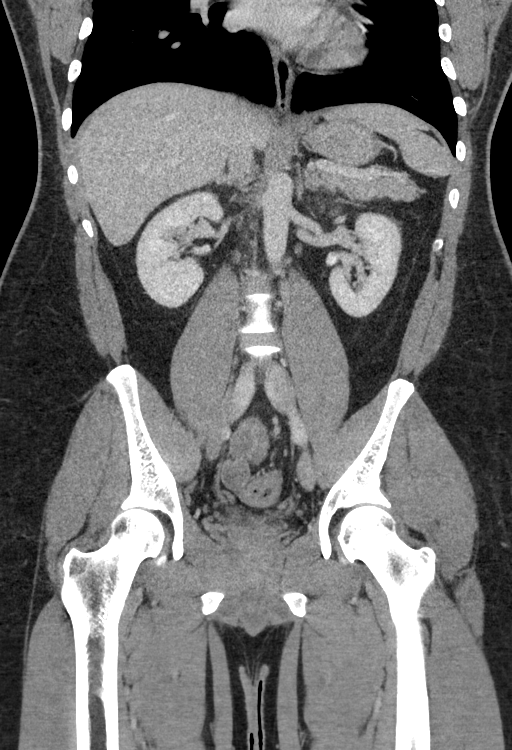

[15 of 46 positions shown; findings below may reference images not displayed]

FINDINGS: Lower chest: Visualized lung bases are clear.

Hepatobiliary: Liver demonstrates a normal contrast enhanced
appearance. Gallbladder within normal limits. No biliary dilatation.

Pancreas: Pancreas within normal limits.

Spleen: Spleen within normal limits.

Adrenals/Urinary Tract: Adrenal glands are normal. Kidneys equal in
size with symmetric enhancement. Subcentimeter hypodensity within
the interpolar left kidney noted, too small the characterize, but
statistically likely reflects a small cyst. No nephrolithiasis,
hydronephrosis, or focal enhancing renal mass. No hydroureter.
Partially distended bladder within normal limits.

Stomach/Bowel: Stomach within normal limits. No evidence for bowel
obstruction. Normal appendix. Circumferential wall thickening with
edema and mild hazy stranding about the colon extending from the
mid-distal transverse colon to the rectum, consistent with acute
colitis. This could be either infectious or inflammatory in nature.
No associated complication. No other acute inflammatory changes seen
about the bowels.

Vascular/Lymphatic: Normal intravascular enhancement seen throughout
the intra-abdominal aorta. Mesenteric vessels are patent proximally.
No adenopathy.

Reproductive: Prostate and seminal vesicles are normal.

Other: No free air or fluid.

Musculoskeletal: No acute osseous finding. No discrete lytic or
blastic osseous lesions.
IMPRESSION: 1. Circumferential wall thickening with edema and hazy stranding
about the colon, consistent with acute colitis. This may be either
infectious or inflammatory in nature. No complication.
2. No other acute intra-abdominal or pelvic process.
